# Patient Record
Sex: Male | Born: 2000 | Race: Black or African American | Hispanic: No | Marital: Single | Smoking: Never smoker
Health system: Southern US, Community
[De-identification: ages and names within clinical notes are randomized; demographics above are authoritative.]

---
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
## 2020-06-20 DIAGNOSIS — Z708 Other sex counseling: Secondary | ICD-10-CM | POA: Diagnosis not present

## 2020-06-20 DIAGNOSIS — Z0000 Encounter for general adult medical examination without abnormal findings: Secondary | ICD-10-CM | POA: Diagnosis not present

## 2020-06-20 DIAGNOSIS — Z13220 Encounter for screening for lipoid disorders: Secondary | ICD-10-CM | POA: Diagnosis not present

## 2020-06-20 DIAGNOSIS — Z3009 Encounter for other general counseling and advice on contraception: Secondary | ICD-10-CM | POA: Diagnosis not present

## 2020-07-09 DIAGNOSIS — R45851 Suicidal ideations: Secondary | ICD-10-CM | POA: Diagnosis not present

## 2020-07-09 DIAGNOSIS — F419 Anxiety disorder, unspecified: Secondary | ICD-10-CM | POA: Diagnosis not present

## 2020-07-09 DIAGNOSIS — R41840 Attention and concentration deficit: Secondary | ICD-10-CM | POA: Diagnosis not present

## 2020-07-09 DIAGNOSIS — R4589 Other symptoms and signs involving emotional state: Secondary | ICD-10-CM | POA: Diagnosis not present

## 2020-07-16 DIAGNOSIS — F411 Generalized anxiety disorder: Secondary | ICD-10-CM | POA: Diagnosis not present

## 2020-07-16 DIAGNOSIS — F4001 Agoraphobia with panic disorder: Secondary | ICD-10-CM | POA: Diagnosis not present

## 2020-07-16 DIAGNOSIS — F329 Major depressive disorder, single episode, unspecified: Secondary | ICD-10-CM | POA: Diagnosis not present

## 2020-07-16 DIAGNOSIS — F909 Attention-deficit hyperactivity disorder, unspecified type: Secondary | ICD-10-CM | POA: Diagnosis not present

## 2020-12-01 ENCOUNTER — Emergency Department (HOSPITAL_COMMUNITY)
Admission: EM | Admit: 2020-12-01 | Discharge: 2020-12-01 | Disposition: A | Discharge status: Home / Self Care | Payer: Medicaid Other | Attending: Emergency Medicine | Admitting: Emergency Medicine

## 2020-12-01 ENCOUNTER — Encounter (HOSPITAL_COMMUNITY): Payer: Self-pay | Admitting: Emergency Medicine

## 2020-12-01 DIAGNOSIS — U071 COVID-19: Secondary | ICD-10-CM | POA: Insufficient documentation

## 2020-12-01 DIAGNOSIS — Z20822 Contact with and (suspected) exposure to covid-19: Secondary | ICD-10-CM

## 2020-12-01 DIAGNOSIS — Z28310 Unvaccinated for covid-19: Secondary | ICD-10-CM | POA: Diagnosis not present

## 2020-12-01 DIAGNOSIS — J029 Acute pharyngitis, unspecified: Secondary | ICD-10-CM | POA: Diagnosis present

## 2020-12-01 LAB — SARS CORONAVIRUS 2 (TAT 6-24 HRS): SARS Coronavirus 2: POSITIVE — AB

## 2020-12-01 NOTE — ED Provider Notes (Signed)
Craig Evans [facility] EMERGENCY DEPARTMENT Provider Note   CSN: [id number] Arrival date & time: 12/01/20  1535     History No chief complaint on file.   Craig Evans is a 20 y.o. male with no pertinent past medical history, is here for evaluation of headache, body aches, sore throat, and fatigue.  He was around his cousin over the past few days and they had a positive COVID test.  He states that he did have COVID before however it was not recently.  He is not vaccinated.  He denies any specific chest pain or leg swelling.  No significant shortness of breath.  He has not taken a test at home and has not attempted anything to treat his symptoms at home.  He denies any syncopal events.  He states that this feels similar to when he had COVID in the past.  HPI     History reviewed. No pertinent past medical history.  There are no problems to display for this patient.   History reviewed. No pertinent surgical history.     No family history on file.     Home Medications Prior to Admission medications   Not on File    Allergies    Patient has no known allergies.  Review of Systems   Review of Systems  Constitutional:  Positive for appetite change, chills and fatigue. Negative for fever.  HENT:  Positive for sore throat.   Respiratory:  Positive for cough. Negative for shortness of breath.   Gastrointestinal:  Negative for abdominal pain, nausea and vomiting.  Musculoskeletal:  Positive for arthralgias and myalgias.  Neurological:  Positive for headaches. Negative for weakness.  Psychiatric/Behavioral:  Negative for confusion.   All other systems reviewed and are negative.  Physical Exam Updated Vital Signs BP (!) 133/93 (BP Location: Right Arm)   Pulse 84   Temp 99.3 F (37.4 C)   Resp 16   SpO2 100%   Physical Exam Vitals and nursing note reviewed.  Constitutional:      General: He is not in acute distress.    Appearance: He is not ill-appearing.   HENT:     Head: Atraumatic.     Mouth/Throat:     Mouth: Mucous membranes are moist.     Pharynx: Oropharynx is clear. No oropharyngeal exudate or posterior oropharyngeal erythema.  Eyes:     Conjunctiva/sclera: Conjunctivae normal.  Cardiovascular:     Rate and Rhythm: Normal rate.     Pulses: Normal pulses.     Heart sounds: Normal heart sounds.  Pulmonary:     Effort: Pulmonary effort is normal. No respiratory distress.     Breath sounds: Normal breath sounds.  Abdominal:     General: There is no distension.  Musculoskeletal:     Cervical back: Normal range of motion and neck supple.     Comments: No obvious acute injury  Skin:    General: Skin is warm.  Neurological:     Mental Status: He is alert.     Comments: Awake and alert, answers all questions appropriately.  Speech is not slurred.  Psychiatric:        Mood and Affect: Mood normal.        Behavior: Behavior normal.    ED Results / Procedures / Treatments   Labs (all labs ordered are listed, but only abnormal results are displayed) Labs Reviewed  SARS CORONAVIRUS 2 (TAT 6-24 HRS) - Abnormal; Notable for the following components:  Result Value   SARS Coronavirus 2 POSITIVE (*)    All other components within normal limits    EKG None  Radiology No results found.  Procedures Procedures   Medications Ordered in ED Medications - No data to display  ED Course  I have reviewed the triage vital signs and the nursing notes.  Pertinent labs & imaging results that were available during my care of the patient were reviewed by me and considered in my medical decision making (see chart for details).    MDM Rules/Calculators/A&P                         Craig Evans was evaluated in Emergency Department on 12/01/2020 for the symptoms described in the history of present illness. He was evaluated in the context of the global COVID-19 pandemic, which necessitated consideration that the patient might be at risk  for infection with the SARS-CoV-2 virus that causes COVID-19. Institutional protocols and algorithms that pertain to the evaluation of patients at risk for COVID-19 are in a state of rapid change based on information released by regulatory bodies including the CDC and federal and state organizations. These policies and algorithms were followed during the patient's care in the ED.  Patient is a otherwise healthy 20 year old man who presents today for evaluation of COVID-like symptoms after a COVID exposure.  We will send COVID test.  He does not have any comorbid conditions that would qualify him for antivirals, he is not hypoxic and generally well-appearing.  Recommend conservative care, including that if his test returns negative he needs to isolate appropriately I would suspect a false negative.  He is 100% on room air.  Not short of breath, lungs clear to auscultation bilaterally.  Note: Portions of this report may have been transcribed using voice recognition software. Every effort was made to ensure accuracy; however, inadvertent computerized transcription errors may be present    Final Clinical Impression(s) / ED Diagnoses Final diagnoses:  Suspected COVID-19 virus infection    Rx / DC Orders ED Discharge Orders     None        Norman Clay 12/01/20 2232    Gloris Manchester, MD 12/02/20 [phone]

## 2020-12-01 NOTE — ED Triage Notes (Signed)
Pt here with h/a and body aches , was around his cousin over the last few days and they tested positive for covid

## 2020-12-01 NOTE — Discharge Instructions (Addendum)
As we discussed today I recommend that you consider getting vaccinated for COVID in about 2 months. Additionally I would recommend that if your symptoms worsen, you develop significant pain in any 1 specific part in your chest, you develop significant shortness of breath, weakness or have any other worsening symptoms or concerns please seek additional medical care and evaluation. Additionally given that you had a COVID exposure and are now having COVID-like symptoms if your test comes back negative I would still recommend that you take appropriate precautions as it may be a false negative.  Please take Ibuprofen (Advil, motrin) and Tylenol (acetaminophen) to relieve your pain.    You may take up to 600 MG (3 pills) of normal strength ibuprofen every 8 hours as needed.   You make take tylenol, up to 1,000 mg (two extra strength pills) every 8 hours as needed.   It is safe to take ibuprofen and tylenol at the same time as they work differently.   Do not take more than 3,000 mg tylenol in a 24 hour period (not more than one dose every 8 hours.  Please check all medication labels as many medications such as pain and cold medications may contain tylenol.  Do not drink alcohol while taking these medications.  Do not take other NSAID'S while taking ibuprofen (such as aleve or naproxen).  Please take ibuprofen with food to decrease stomach upset.

## 2021-02-03 DIAGNOSIS — K5900 Constipation, unspecified: Secondary | ICD-10-CM | POA: Diagnosis not present

## 2021-02-03 DIAGNOSIS — R100 Acute abdomen: Secondary | ICD-10-CM | POA: Diagnosis not present

## 2021-06-08 ENCOUNTER — Emergency Department (HOSPITAL_COMMUNITY): Payer: Medicaid Other

## 2021-06-08 ENCOUNTER — Emergency Department (HOSPITAL_COMMUNITY)
Admission: EM | Admit: 2021-06-08 | Discharge: 2021-06-08 | Disposition: A | Discharge status: Home / Self Care | Payer: Medicaid Other | Attending: Emergency Medicine | Admitting: Emergency Medicine

## 2021-06-08 ENCOUNTER — Encounter (HOSPITAL_COMMUNITY): Payer: Self-pay | Admitting: Emergency Medicine

## 2021-06-08 DIAGNOSIS — R569 Unspecified convulsions: Secondary | ICD-10-CM | POA: Diagnosis not present

## 2021-06-08 DIAGNOSIS — Z20822 Contact with and (suspected) exposure to covid-19: Secondary | ICD-10-CM | POA: Insufficient documentation

## 2021-06-08 DIAGNOSIS — R9431 Abnormal electrocardiogram [ECG] [EKG]: Secondary | ICD-10-CM | POA: Diagnosis not present

## 2021-06-08 LAB — CBC
HCT: 41.9 % (ref 39.0–52.0)
Hemoglobin: 14.3 g/dL (ref 13.0–17.0)
MCH: 30.2 pg (ref 26.0–34.0)
MCHC: 34.1 g/dL (ref 30.0–36.0)
MCV: 88.6 fL (ref 80.0–100.0)
Platelets: 246 10*3/uL (ref 150–400)
RBC: 4.73 MIL/uL (ref 4.22–5.81)
RDW: 11.5 % (ref 11.5–15.5)
WBC: 5.1 10*3/uL (ref 4.0–10.5)
nRBC: 0 % (ref 0.0–0.2)

## 2021-06-08 LAB — BASIC METABOLIC PANEL
Anion gap: 7 (ref 5–15)
BUN: 12 mg/dL (ref 6–20)
CO2: 28 mmol/L (ref 22–32)
Calcium: 9.4 mg/dL (ref 8.9–10.3)
Chloride: 103 mmol/L (ref 98–111)
Creatinine, Ser: 0.93 mg/dL (ref 0.61–1.24)
GFR, Estimated: >60 mL/min (ref >60)
Glucose, Bld: 95 mg/dL (ref 70–99)
Potassium: 3.8 mmol/L (ref 3.5–5.1)
Sodium: 138 mmol/L (ref 135–145)

## 2021-06-08 LAB — RAPID URINE DRUG SCREEN, HOSP PERFORMED
Amphetamines: NOT DETECTED
Barbiturates: NOT DETECTED
Benzodiazepines: NOT DETECTED
Cocaine: NOT DETECTED
Opiates: NOT DETECTED
Tetrahydrocannabinol: NOT DETECTED

## 2021-06-08 LAB — CBG MONITORING, ED: Glucose-Capillary: 88 mg/dL (ref 70–99)

## 2021-06-08 LAB — RESP PANEL BY RT-PCR (FLU A&B, COVID) ARPGX2
Influenza A by PCR: NEGATIVE
Influenza B by PCR: NEGATIVE
SARS Coronavirus 2 by RT PCR: NEGATIVE

## 2021-06-08 MED ORDER — LEVETIRACETAM 500 MG PO TABS: 500.0000 mg | ORAL_TABLET | Freq: Two times a day (BID) | ORAL | 0 refills | Status: AC

## 2021-06-08 MED ORDER — LEVETIRACETAM 500 MG PO TABS
1500.0000 mg | ORAL_TABLET | Freq: Once | ORAL | Status: AC
  Administered 2021-06-08: 1500 mg via ORAL
  Filled 2021-06-08: qty 3

## 2021-06-08 NOTE — ED Provider Notes (Signed)
[facility] EMERGENCY DEPARTMENT Provider Note   CSN: [id number] Arrival date & time: 06/08/21  1135     History  Chief Complaint  Patient presents with   Seizures    Craig Evans is a 21 y.o. male with no significant past medical history who presents to the ED due to seizure-like activity.  Patient states 2 nights ago his boyfriend witnessed him have 2 seizure-like activities while he was sleeping.  Patient notes his boyfriend described it as his head and arms shaking.  When patient was woken up there was no confusion per patient.  Patient also states he had 1 episode last night.  No urinary incontinence.  No mouth trauma.  Patient notes he has had 2 previous seizures when he was 10 months and 2 years however, has never been on any antiepileptic medications. Unsure whether or not those were febrile seizures or not.  Denies daily alcohol use.  No drug use.  Patient endorses mild rhinorrhea and sore throat over the weekend.  No fever.       Home Medications Prior to Admission medications   Not on File      Allergies    Patient has no known allergies.    Review of Systems   Review of Systems  Constitutional:  Negative for appetite change and fever.  Eyes:  Negative for visual disturbance.  Neurological:  Positive for seizures. Negative for facial asymmetry, weakness and numbness.   Physical Exam Updated Vital Signs BP (!) 132/94 (BP Location: Right Arm)    Pulse 71    Temp 98.7 F (37.1 C) (Oral)    Resp 18    SpO2 97%  Physical Exam Vitals and nursing note reviewed.  Constitutional:      General: He is not in acute distress.    Appearance: He is not ill-appearing.  HENT:     Head: Normocephalic.  Eyes:     Pupils: Pupils are equal, round, and reactive to light.  Cardiovascular:     Rate and Rhythm: Normal rate and regular rhythm.     Pulses: Normal pulses.     Heart sounds: Normal heart sounds. No murmur heard.   No friction rub. No gallop.   Pulmonary:     Effort: Pulmonary effort is normal.     Breath sounds: Normal breath sounds.  Abdominal:     General: Abdomen is flat. There is no distension.     Palpations: Abdomen is soft.     Tenderness: There is no abdominal tenderness. There is no guarding or rebound.  Musculoskeletal:        General: Normal range of motion.     Cervical back: Neck supple.  Skin:    General: Skin is warm and dry.  Neurological:     General: No focal deficit present.     Mental Status: He is alert.     Comments: Speech is clear, able to follow commands CN III-XII intact Normal strength in upper and lower extremities bilaterally including dorsiflexion and plantar flexion, strong and equal grip strength Sensation grossly intact throughout Moves extremities without ataxia, coordination intact No pronator drift Ambulates without difficulty  Psychiatric:        Mood and Affect: Mood normal.        Behavior: Behavior normal.    ED Results / Procedures / Treatments   Labs (all labs ordered are listed, but only abnormal results are displayed) Labs Reviewed  RESP PANEL BY RT-PCR (FLU A&B, COVID) ARPGX2  BASIC METABOLIC PANEL  CBC  RAPID URINE DRUG SCREEN, HOSP PERFORMED  CBG MONITORING, ED    EKG EKG Interpretation  Date/Time:  Tuesday June 08 2021 11:55:38 EST Ventricular Rate:  70 PR Interval:  144 QRS Duration: 86 QT Interval:  348 QTC Calculation: 375 R Axis:   92 Text Interpretation: Normal sinus rhythm with sinus arrhythmia Rightward axis Borderline ECG No previous ECGs available Confirmed by Isla Pence [id number]) on 06/08/2021 1:42:10 PM  Radiology CT Head Wo Contrast  Result Date: 06/08/2021 CLINICAL DATA:  Seizures EXAM: CT HEAD WITHOUT CONTRAST TECHNIQUE: Contiguous axial images were obtained from the base of the skull through the vertex without intravenous contrast. RADIATION DOSE REDUCTION: This exam was performed according to the departmental dose-optimization  program which includes automated exposure control, adjustment of the mA and/or kV according to patient size and/or use of iterative reconstruction technique. COMPARISON:  None. FINDINGS: Brain: No acute intracranial findings are seen. Ventricles are not dilated. There is no shift of midline structures. There are no epidural or subdural fluid collections. Vascular: Unremarkable. Skull: No fracture is seen. Sinuses/Orbits: There is mild mucosal thickening in the ethmoid sinus. Other: None IMPRESSION: No acute intracranial findings are seen in noncontrast CT brain. Electronically Signed   By: Elmer Picker M.D.   On: 06/08/2021 14:43    Procedures Procedures    Medications Ordered in ED Medications - No data to display  ED Course/ Medical Decision Making/ A&P                           Medical Decision Making Amount and/or Complexity of Data Reviewed Independent Historian: friend    Details: patient's friend able to give some history Labs: ordered. Decision-making details documented in ED Course. Radiology: ordered and independent interpretation performed. Decision-making details documented in ED Course.  Risk Prescription drug management.   21 year old male presents to the ED due to seizure-like activity.  Patient had 2 episodes 2 nights ago and one episode last night.  Patient notes each seizure-like activity occurred while he was sleeping.  No urinary incontinence or mouth trauma.  Patient has had seizures when he was 10 months and 2 years however, unsure whether or not they were febrile seizures or not  He has never been on any antiepileptic medications.  Upon arrival, stable vitals.  Patient in no acute distress.  Benign physical exam.  Normal neurological exam without any neurological deficits.  Routine labs ordered to rule out electrolyte abnormalities.  CT head to rule out intracranial abnormalities. No focal deficits on exam. Denies daily alcohol use. Low suspicion for alcohol  withdrawal.   CBC unremarkable.  No leukocytosis and normal hemoglobin.  BMP unremarkable.  Normal renal function no major electrolyte derangements.  UDS negative.  COVID/influenza negative.  CT head personally reviewed and interpreted which is negative for any acute abnormalities.  EKG demonstrates normal sinus rhythm with no signs of acute ischemia.  3:11 PM Discussed with Dr. Lorrin Goodell with neurology who recommends loading dose of 1500mg  Keppra and discharge with 500mg  BID with outpatient EEG given he has had 3 episodes in 72 hours.  Patient has been observed for over 3.5 hours with no seizure like activity. Feel patient is stable for discharge. Strict ED precautions discussed with patient. Patient states understanding and agrees to plan. Patient discharged home in no acute distress and stable vitals        Final Clinical Impression(s) / ED Diagnoses Final diagnoses:  Seizure-like activity [facility])    Rx / DC Orders ED Discharge Orders     None         Karie Kirks 06/08/21 1513    Isla Pence, MD 06/08/21 [phone]

## 2021-06-08 NOTE — ED Triage Notes (Signed)
Patient here for evaluation of seizures, states his boyfriend witnessed him having what he believes was a seizure while he was sleep. Patient reports having seizures as a child but is not medicated for seizures. Denies febrile seizures. Patient is alert, oriented, and in no apparent distress at this time.

## 2021-06-08 NOTE — ED Notes (Signed)
Patient transported to CT 

## 2021-06-08 NOTE — ED Provider Triage Note (Signed)
Emergency Medicine Provider Triage Evaluation Note  BRIANNA BERTOLET , a 21 y.o. male  was evaluated in triage.  Pt complains of seizure last night while sleeping. Seizure was witnessed by the patient's boyfriend. 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 Reports runny nose and sore throat the past couple days, no fever.   Review of Systems  Positive: seizure Negative: Headache, numbness, weakness  Physical Exam  BP (!) 132/94 (BP Location: Right Arm)    Pulse 71    Temp 98.7 F (37.1 C) (Oral)    Resp 18    SpO2 97%  Gen:   Awake, no distress   Resp:  Normal effort  MSK:   Moves extremities without difficulty  Other:  A&O x4  Medical Decision Making  Medically screening exam initiated at 11:54 AM.  Appropriate orders placed.  Nuno YAMA MAFI was informed that the remainder of the evaluation will be completed by another provider, this initial triage assessment does not replace that evaluation, and the importance of remaining in the ED until their evaluation is complete.     Kateri Plummer, PA-C 06/08/21 1156

## 2021-06-08 NOTE — Discharge Instructions (Addendum)
It was a pleasure taking care of you today. As discussed, all of your labs were reassuring. I have placed a referral to neurology. They will call you within the next week. Return to the ER for new or worsening symptoms.   I spoke to neurology. They recommend starting Keppra 500mg  twice daily until you follow-up with neurology. Take your first dose tonight.  Do not drive until evaluated by neurology

## 2021-07-21 DIAGNOSIS — H5213 Myopia, bilateral: Secondary | ICD-10-CM | POA: Diagnosis not present

## 2021-08-16 DIAGNOSIS — H5213 Myopia, bilateral: Secondary | ICD-10-CM | POA: Diagnosis not present

## 2021-09-13 DIAGNOSIS — S93402S Sprain of unspecified ligament of left ankle, sequela: Secondary | ICD-10-CM | POA: Diagnosis not present

## 2021-09-13 DIAGNOSIS — M25572 Pain in left ankle and joints of left foot: Secondary | ICD-10-CM | POA: Diagnosis not present

## 2021-09-24 DIAGNOSIS — S0502XA Injury of conjunctiva and corneal abrasion without foreign body, left eye, initial encounter: Secondary | ICD-10-CM | POA: Diagnosis not present

## 2021-09-24 DIAGNOSIS — H02054 Trichiasis without entropian left upper eyelid: Secondary | ICD-10-CM | POA: Diagnosis not present

## 2021-09-24 DIAGNOSIS — H15112 Episcleritis periodica fugax, left eye: Secondary | ICD-10-CM | POA: Diagnosis not present

## 2022-01-12 ENCOUNTER — Ambulatory Visit (INDEPENDENT_AMBULATORY_CARE_PROVIDER_SITE_OTHER): Payer: Medicaid Other

## 2022-01-12 ENCOUNTER — Ambulatory Visit (HOSPITAL_COMMUNITY)
Admission: EM | Admit: 2022-01-12 | Discharge: 2022-01-12 | Disposition: A | Discharge status: Home / Self Care | Payer: Medicaid Other | Attending: Nurse Practitioner | Admitting: Nurse Practitioner

## 2022-01-12 ENCOUNTER — Encounter (HOSPITAL_COMMUNITY): Payer: Self-pay | Admitting: Emergency Medicine

## 2022-01-12 DIAGNOSIS — M542 Cervicalgia: Secondary | ICD-10-CM | POA: Diagnosis not present

## 2022-01-12 DIAGNOSIS — R519 Headache, unspecified: Secondary | ICD-10-CM
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
## 2022-01-12 NOTE — Discharge Instructions (Addendum)
Your neck xray reveals no acute fracture or static subluxation. However if your pain persist the recommendation is to have another xray.  Recommend the use of Ibuprofen 400-600 mg up to three times a day. Do not exceed the reccommended daily dose. Follow up if symptoms persist

## 2022-01-20 DIAGNOSIS — Z202 Contact with and (suspected) exposure to infections with a predominantly sexual mode of transmission: Secondary | ICD-10-CM | POA: Diagnosis not present

## 2022-03-04 IMAGING — CT CT HEAD W/O CM
3 series · 15 of 47 positions shown, 18 images · non-contrast
Comparison: None.

CLINICAL DATA: Seizures



[Series 3: head 5.0 h30s · axial · 0.43mm/px · z∈[-101,+44]mm · 9 of 35 slices shown, 12 images]
[im 3/35  brain]
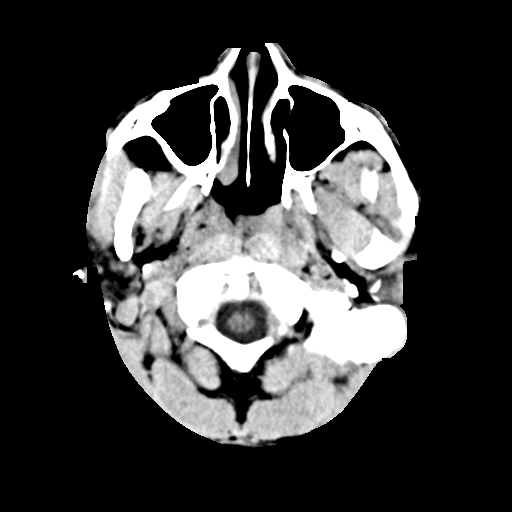
[im 3/35  bone]
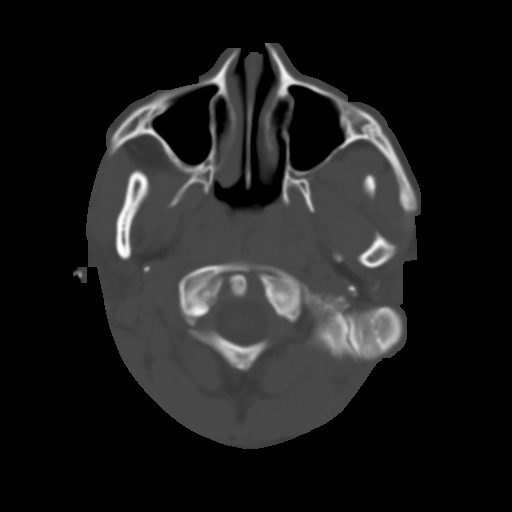
[im 6/35  brain]
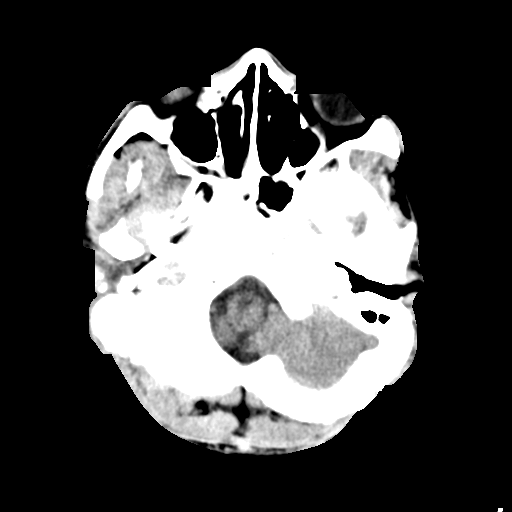
[im 10/35  brain]
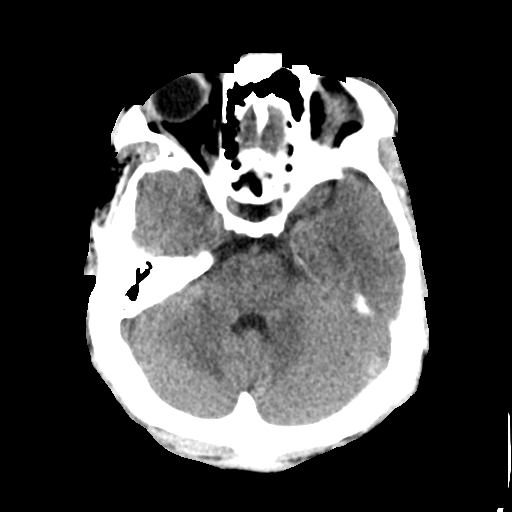
[im 13/35  brain]
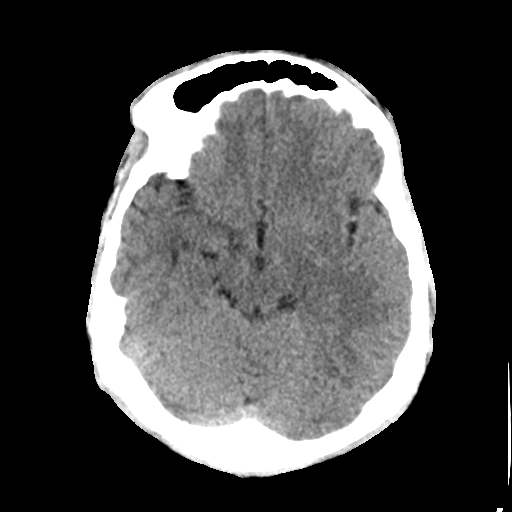
[im 18/35  brain]
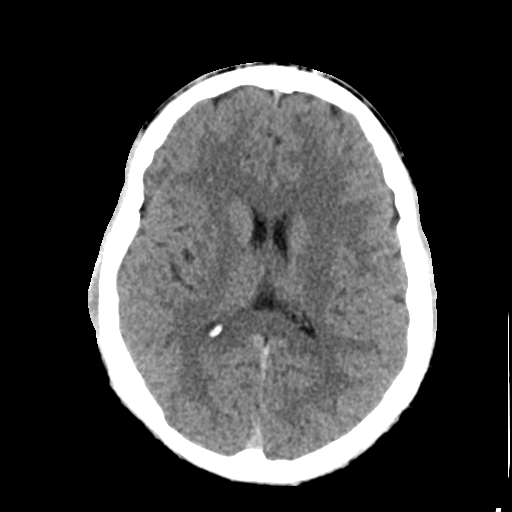
[im 18/35  bone]
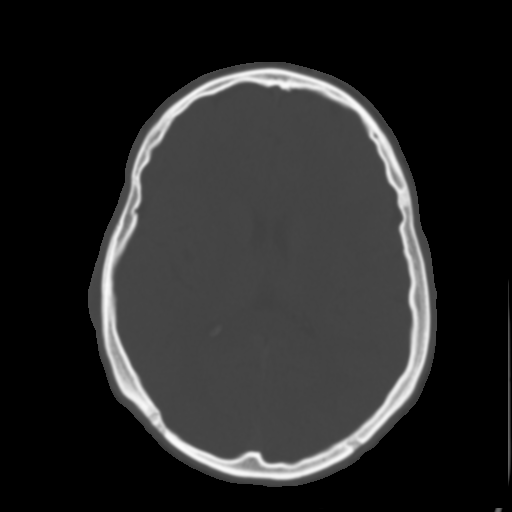
[im 22/35  brain]
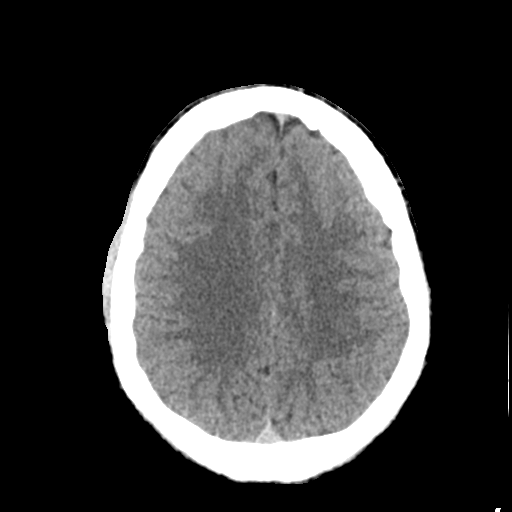
[im 25/35  brain]
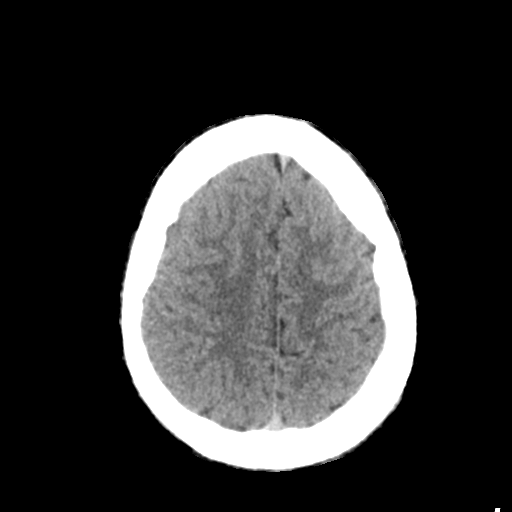
[im 29/35  brain]
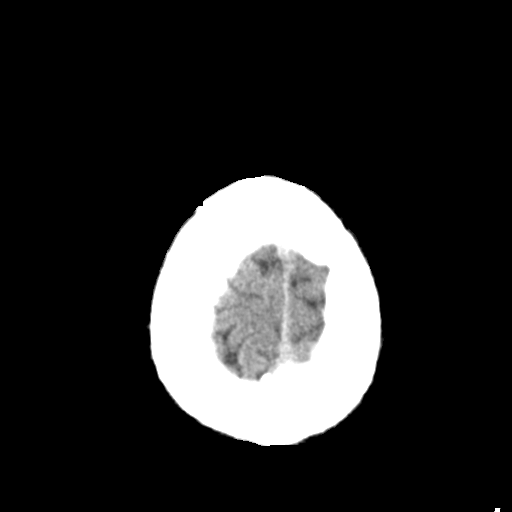
[im 32/35  brain]
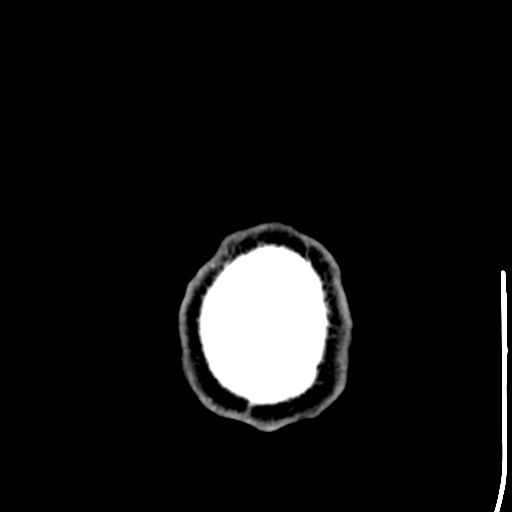
[im 32/35  bone]
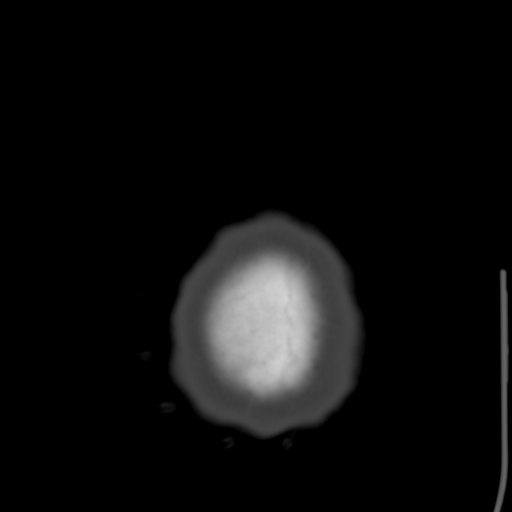

[Series 5: head 3.0 mpr cor · coronal · 0.34mm/px · 3 of 67 slices shown]
[im 23/67  brain]
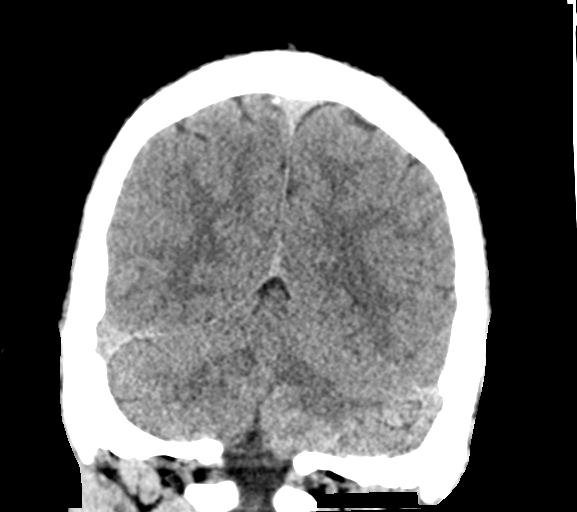
[im 30/67  brain]
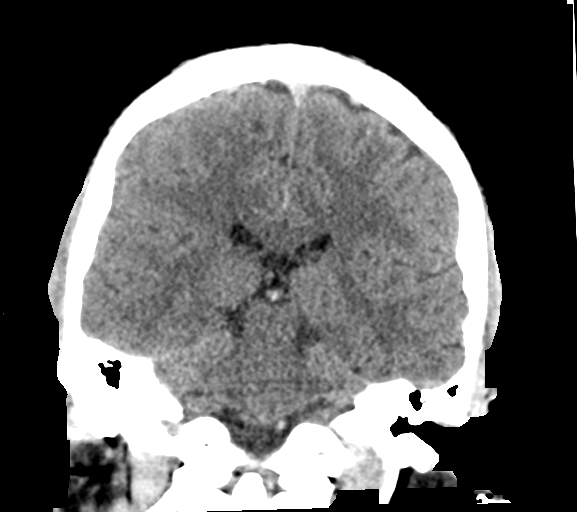
[im 37/67  brain]
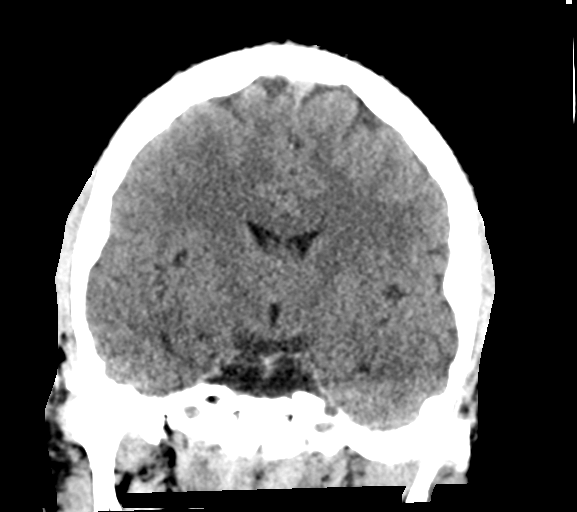

[Series 6: head 3.0 mpr sag · sagittal · 0.34mm/px · 3 of 65 slices shown]
[im 22/65  brain]
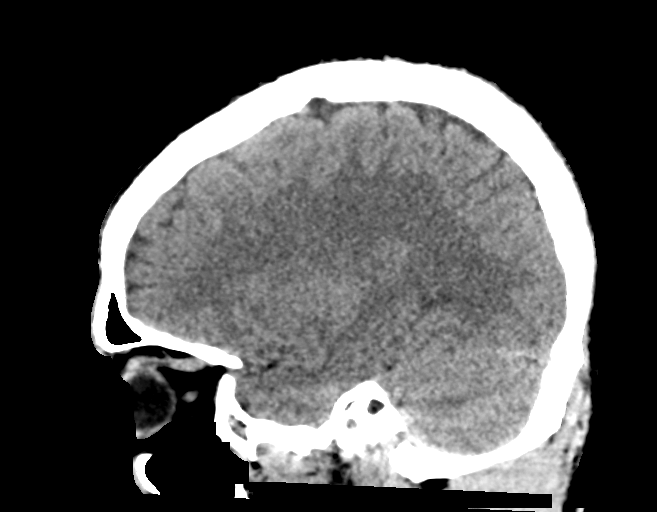
[im 33/65  brain]
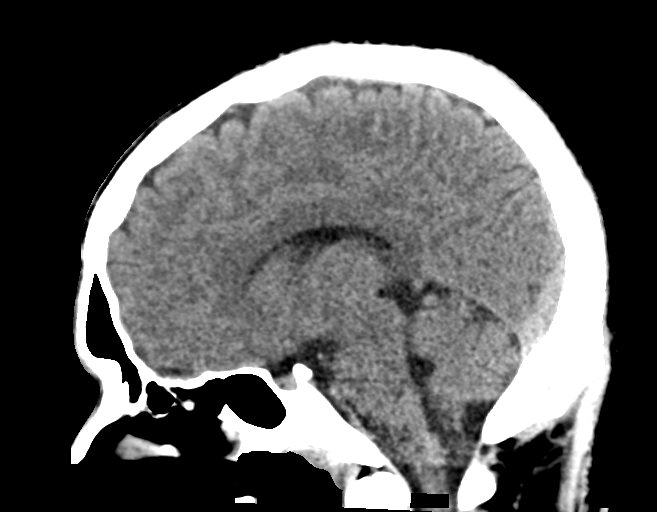
[im 43/65  brain]
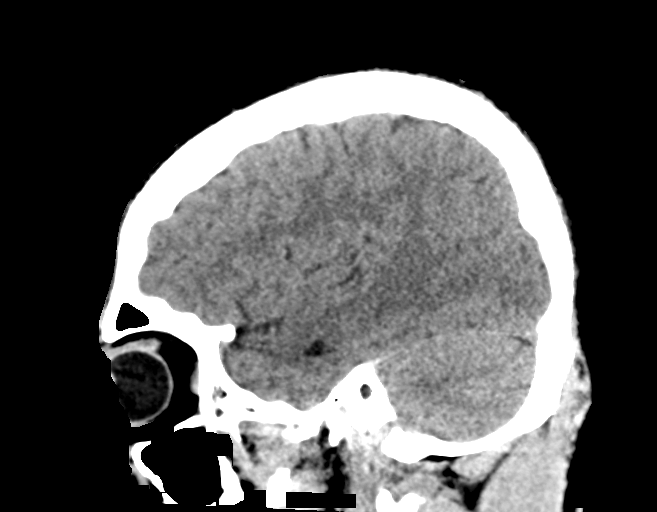

[15 of 47 positions shown; findings below may reference images not displayed]

FINDINGS: Brain: No acute intracranial findings are seen. Ventricles are not
dilated. There is no shift of midline structures. There are no
epidural or subdural fluid collections.

Vascular: Unremarkable.

Skull: No fracture is seen.

Sinuses/Orbits: There is mild mucosal thickening in the ethmoid
sinus.

Other: None
IMPRESSION: No acute intracranial findings are seen in noncontrast CT brain.
